# Patient Record
Sex: Male | Born: 1975 | Hispanic: Yes | Marital: Single | Smoking: Current every day smoker
Health system: Southern US, Community
[De-identification: ages and names within clinical notes are randomized; demographics above are authoritative.]

## PROBLEM LIST (undated history)

## (undated) DIAGNOSIS — E7800 Pure hypercholesterolemia, unspecified: Secondary | ICD-10-CM

## (undated) DIAGNOSIS — I10 Essential (primary) hypertension: Secondary | ICD-10-CM

## (undated) DIAGNOSIS — E119 Type 2 diabetes mellitus without complications: Secondary | ICD-10-CM

## (undated) HISTORY — DX: Type 2 diabetes mellitus without complications: E11.9

## (undated) HISTORY — PX: KNEE SURGERY: SHX244

---

## 2012-03-15 ENCOUNTER — Encounter (HOSPITAL_BASED_OUTPATIENT_CLINIC_OR_DEPARTMENT_OTHER): Payer: Self-pay

## 2012-03-15 ENCOUNTER — Emergency Department (HOSPITAL_BASED_OUTPATIENT_CLINIC_OR_DEPARTMENT_OTHER)
Admission: EM | Admit: 2012-03-15 | Discharge: 2012-03-15 | Disposition: A | Discharge status: Home / Self Care | Payer: BC Managed Care – PPO | Attending: Emergency Medicine | Admitting: Emergency Medicine

## 2012-03-15 DIAGNOSIS — I10 Essential (primary) hypertension: Secondary | ICD-10-CM | POA: Insufficient documentation

## 2012-03-15 DIAGNOSIS — H6692 Otitis media, unspecified, left ear: Secondary | ICD-10-CM

## 2012-03-15 DIAGNOSIS — H6690 Otitis media, unspecified, unspecified ear: Secondary | ICD-10-CM | POA: Insufficient documentation

## 2012-03-15 DIAGNOSIS — F17200 Nicotine dependence, unspecified, uncomplicated: Secondary | ICD-10-CM | POA: Insufficient documentation

## 2012-03-15 HISTORY — DX: Essential (primary) hypertension: I10

## 2012-03-15 MED ORDER — AMOXICILLIN 500 MG PO TABS: 1000.0000 mg | ORAL_TABLET | Freq: Three times a day (TID) | ORAL | Status: AC

## 2012-03-15 NOTE — ED Notes (Signed)
C/o "congestion" to left ear and decreased hearing-denies pian

## 2012-03-15 NOTE — ED Provider Notes (Signed)
History     CSN: [id number]  Arrival date & time 03/15/12  2117   First MD Initiated Contact with Patient 03/15/12 2152      Chief Complaint  Patient presents with  . Hearing Problem    (Consider location/radiation/quality/duration/timing/severity/associated sxs/prior treatment) HPI 2 weeks ago this 36 year old male has nasal congestion and a slight cough with some left ear congestion as well. His cough and nasal congestion have essentially resolved with only minimal residual nasal congestion. He however has persistent left ear fullness congestion feeling. He does not have severe left ear pain. Headache. He has no stiff neck. He is no rash. He is no current cough shortness of breath chest pain abdominal pain or vomiting. He is no vertigo. He took an over-the-counter decongestant occasionally over the last couple weeks which helped his nasal congestion. There is no treatment prior to arrival for the ear. He only has mild pain to the ear. Past Medical History  Diagnosis Date  . Hypertension     History reviewed. No pertinent past surgical history.  No family history on file.  History  Substance Use Topics  . Smoking status: Current Everyday Smoker  . Smokeless tobacco: Not on file  . Alcohol Use: Yes      Review of Systems  Constitutional: Negative for fever.       10 Systems reviewed and are negative for acute change except as noted in the HPI.  HENT: Positive for ear pain and congestion. Negative for facial swelling, neck pain, neck stiffness and ear discharge.   Eyes: Negative for discharge and redness.  Respiratory: Negative for cough and shortness of breath.   Cardiovascular: Negative for chest pain.  Gastrointestinal: Negative for vomiting and abdominal pain.  Musculoskeletal: Negative for back pain.  Skin: Negative for rash.  Neurological: Negative for syncope, numbness and headaches.  Psychiatric/Behavioral:       No behavior change.    Allergies  Review of  patient's allergies indicates no known allergies.  Home Medications   Current Outpatient Rx  Name Route Sig Dispense Refill  . VITAMIN D 1000 UNITS PO TABS Oral Take 1,000 Units by mouth daily.    Marland Kitchen HYDROCHLOROTHIAZIDE 12.5 MG PO CAPS Oral Take 12.5 mg by mouth daily.    . NEBIVOLOL HCL 10 MG PO TABS Oral Take 10 mg by mouth daily.    . DECONGESTANT PO Oral Take 1 tablet by mouth daily as needed. Patient used this medication for congestion.    Marland Kitchen ROSUVASTATIN CALCIUM 10 MG PO TABS Oral Take 10 mg by mouth daily.    . AMOXICILLIN 500 MG PO TABS Oral Take 2 tablets (1,000 mg total) by mouth 3 (three) times daily. X 10 days 60 tablet 0    BP 173/92  Pulse 83  Temp 98.5 F (36.9 C) (Oral)  Resp 20  Ht 5\' 8"  (1.727 m)  Wt 285 lb (129.275 kg)  BMI 43.33 kg/m2  SpO2 100%  Physical Exam  Nursing note and vitals reviewed. Constitutional:       Awake, alert, nontoxic appearance.  HENT:  Head: Atraumatic.  Right Ear: External ear normal.  Left Ear: External ear normal.  Mouth/Throat: Oropharynx is clear and moist. No oropharyngeal exudate.       Right tympanic membrane is clear, left tympanic membrane is purulent-appearing and bulging with loss of landmarks but normal external auditory canals bilaterally  Eyes: Right eye exhibits no discharge. Left eye exhibits no discharge.  Neck: Neck supple.  Cardiovascular: Normal  rate and regular rhythm.   No murmur heard. Pulmonary/Chest: Effort normal and breath sounds normal. No respiratory distress. He has no wheezes. He has no rales. He exhibits no tenderness.  Abdominal: Soft. There is no tenderness. There is no rebound.  Musculoskeletal: He exhibits no tenderness.       Baseline ROM, no obvious new focal weakness.  Lymphadenopathy:    He has no cervical adenopathy.  Neurological:       Mental status and motor strength appears baseline for patient and situation.  Skin: No rash noted.  Psychiatric: He has a normal mood and affect.     ED Course  Procedures (including critical care time)  Labs Reviewed - No data to display No results found.   1. Otitis media of left ear       MDM   Pt stable in ED with no significant deterioration in condition.Patient / Family / Caregiver informed of clinical course, understand medical decision-making process, and agree with plan.        Hurman Horn, MD 03/19/12 1330

## 2012-03-15 NOTE — ED Notes (Signed)
MD at bedside. 

## 2014-01-30 ENCOUNTER — Encounter (HOSPITAL_BASED_OUTPATIENT_CLINIC_OR_DEPARTMENT_OTHER): Payer: Self-pay | Admitting: Emergency Medicine

## 2014-01-30 ENCOUNTER — Emergency Department (HOSPITAL_BASED_OUTPATIENT_CLINIC_OR_DEPARTMENT_OTHER): Payer: Self-pay

## 2014-01-30 ENCOUNTER — Emergency Department (HOSPITAL_BASED_OUTPATIENT_CLINIC_OR_DEPARTMENT_OTHER)
Admission: EM | Admit: 2014-01-30 | Discharge: 2014-01-30 | Disposition: A | Discharge status: Home / Self Care | Payer: Self-pay | Attending: Emergency Medicine | Admitting: Emergency Medicine

## 2014-01-30 ENCOUNTER — Emergency Department (HOSPITAL_BASED_OUTPATIENT_CLINIC_OR_DEPARTMENT_OTHER): Payer: BC Managed Care – PPO

## 2014-01-30 DIAGNOSIS — E669 Obesity, unspecified: Secondary | ICD-10-CM | POA: Insufficient documentation

## 2014-01-30 DIAGNOSIS — I10 Essential (primary) hypertension: Secondary | ICD-10-CM | POA: Insufficient documentation

## 2014-01-30 DIAGNOSIS — R609 Edema, unspecified: Secondary | ICD-10-CM | POA: Insufficient documentation

## 2014-01-30 DIAGNOSIS — E785 Hyperlipidemia, unspecified: Secondary | ICD-10-CM | POA: Insufficient documentation

## 2014-01-30 DIAGNOSIS — Z79899 Other long term (current) drug therapy: Secondary | ICD-10-CM | POA: Insufficient documentation

## 2014-01-30 DIAGNOSIS — F17200 Nicotine dependence, unspecified, uncomplicated: Secondary | ICD-10-CM | POA: Insufficient documentation

## 2014-01-30 DIAGNOSIS — J159 Unspecified bacterial pneumonia: Secondary | ICD-10-CM | POA: Insufficient documentation

## 2014-01-30 DIAGNOSIS — J189 Pneumonia, unspecified organism: Secondary | ICD-10-CM

## 2014-01-30 HISTORY — DX: Pure hypercholesterolemia, unspecified: E78.00

## 2014-01-30 LAB — CBC WITH DIFFERENTIAL/PLATELET
BASOS ABS: 0 10*3/uL (ref 0.0–0.1)
Basophils Relative: 1 % (ref 0–1)
Eosinophils Absolute: 0.2 10*3/uL (ref 0.0–0.7)
Eosinophils Relative: 3 % (ref 0–5)
HEMATOCRIT: 35.9 % — AB (ref 39.0–52.0)
Hemoglobin: 12.2 g/dL — ABNORMAL LOW (ref 13.0–17.0)
LYMPHS PCT: 17 % (ref 12–46)
Lymphs Abs: 1 10*3/uL (ref 0.7–4.0)
MCH: 27.2 pg (ref 26.0–34.0)
MCHC: 34 g/dL (ref 30.0–36.0)
MCV: 80 fL (ref 78.0–100.0)
Monocytes Absolute: 0.3 10*3/uL (ref 0.1–1.0)
Monocytes Relative: 5 % (ref 3–12)
NEUTROS ABS: 4.7 10*3/uL (ref 1.7–7.7)
Neutrophils Relative %: 76 % (ref 43–77)
PLATELETS: 261 10*3/uL (ref 150–400)
RBC: 4.49 MIL/uL (ref 4.22–5.81)
RDW: 13.4 % (ref 11.5–15.5)
WBC: 6.3 10*3/uL (ref 4.0–10.5)

## 2014-01-30 LAB — BASIC METABOLIC PANEL
BUN: 15 mg/dL (ref 6–23)
CHLORIDE: 103 meq/L (ref 96–112)
CO2: 23 meq/L (ref 19–32)
Calcium: 9 mg/dL (ref 8.4–10.5)
Creatinine, Ser: 1 mg/dL (ref 0.50–1.35)
GFR calc Af Amer: >90 mL/min (ref >90)
GFR calc non Af Amer: >90 mL/min (ref >90)
Glucose, Bld: 243 mg/dL — ABNORMAL HIGH (ref 70–99)
Potassium: 4.3 mEq/L (ref 3.7–5.3)
SODIUM: 140 meq/L (ref 137–147)

## 2014-01-30 LAB — D-DIMER, QUANTITATIVE (NOT AT ARMC): D DIMER QUANT: 0.52 ug{FEU}/mL — AB (ref 0.00–0.48)

## 2014-01-30 LAB — TROPONIN I

## 2014-01-30 LAB — PRO B NATRIURETIC PEPTIDE: PRO B NATRI PEPTIDE: 398.5 pg/mL — AB (ref 0–125)

## 2014-01-30 MED ORDER — ASPIRIN 81 MG PO CHEW
324.0000 mg | CHEWABLE_TABLET | Freq: Once | ORAL | Status: AC
  Administered 2014-01-30: 324 mg via ORAL
  Filled 2014-01-30: qty 4

## 2014-01-30 MED ORDER — CEFTRIAXONE SODIUM 1 G IJ SOLR
INTRAMUSCULAR | Status: AC
  Filled 2014-01-30: qty 10

## 2014-01-30 MED ORDER — LEVOFLOXACIN 750 MG PO TABS: 750.0000 mg | ORAL_TABLET | Freq: Every day | ORAL | Status: DC

## 2014-01-30 MED ORDER — ALBUTEROL SULFATE (2.5 MG/3ML) 0.083% IN NEBU
5.0000 mg | INHALATION_SOLUTION | Freq: Once | RESPIRATORY_TRACT | Status: AC
  Administered 2014-01-30: 5 mg via RESPIRATORY_TRACT
  Filled 2014-01-30: qty 6

## 2014-01-30 MED ORDER — IOHEXOL 350 MG/ML SOLN
100.0000 mL | Freq: Once | INTRAVENOUS | Status: AC | PRN
  Administered 2014-01-30: 100 mL via INTRAVENOUS

## 2014-01-30 MED ORDER — DEXTROSE 5 % IV SOLN
500.0000 mg | Freq: Once | INTRAVENOUS | Status: AC
  Administered 2014-01-30: 500 mg via INTRAVENOUS

## 2014-01-30 MED ORDER — DEXTROSE 5 % IV SOLN
1.0000 g | Freq: Once | INTRAVENOUS | Status: AC
  Administered 2014-01-30: 1 g via INTRAVENOUS

## 2014-01-30 NOTE — ED Provider Notes (Signed)
CSN: [id number]     Arrival date & time 01/30/14  0813 History   First MD Initiated Contact with Patient 01/30/14 [phone]     Chief Complaint  Patient presents with  . Shortness of Breath     (Consider location/radiation/quality/duration/timing/severity/associated sxs/prior Treatment) Patient is a 38 y.o. male presenting with shortness of breath.  Shortness of Breath  Pt with history of obesity, OSA, HTN and HLD but no known CAD reports 2 days of SOB, worse with lying flat and with walking, associated with R sided chest tightness yesterday but none today. He has been off his HTN meds for several months, restarted 3 days ago. 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 No N/V, dysuria. Is a smoker, denies illicit drug use.   Past Medical History  Diagnosis Date  . Hypertension    No past surgical history on file. No family history on file. History  Substance Use Topics  . Smoking status: Current Every Day Smoker  . Smokeless tobacco: Not on file  . Alcohol Use: Yes    Review of Systems  Respiratory: Positive for shortness of breath.    All other systems reviewed and are negative except as noted in HPI.     Allergies  Review of patient's allergies indicates no known allergies.  Home Medications   Prior to Admission medications   Medication Sig Start Date End Date Taking? Authorizing Provider  cholecalciferol (VITAMIN D) 1000 UNITS tablet Take 1,000 Units by mouth daily.    Historical Provider, MD  hydrochlorothiazide (MICROZIDE) 12.5 MG capsule Take 12.5 mg by mouth daily.    Historical Provider, MD  nebivolol (BYSTOLIC) 10 MG tablet Take 10 mg by mouth daily.    Historical Provider, MD  Pseudoephedrine HCl (DECONGESTANT PO) Take 1 tablet by mouth daily as needed. Patient used this medication for congestion.    Historical Provider, MD  rosuvastatin (CRESTOR) 10 MG tablet Take 10 mg by mouth daily.    Historical Provider, MD   BP 159/91  Pulse 85  Resp 20   Ht 5\' 8"  (1.727 m)  Wt 300 lb (136.079 kg)  BMI 45.63 kg/m2  SpO2 97% Physical Exam  Nursing note and vitals reviewed. Constitutional: He is oriented to person, place, and time. He appears well-developed and well-nourished.  HENT:  Head: Normocephalic and atraumatic.  Eyes: EOM are normal. Pupils are equal, round, and reactive to light.  Neck: Normal range of motion. Neck supple.  Cardiovascular: Normal rate, normal heart sounds and intact distal pulses.   Pulmonary/Chest: Effort normal and breath sounds normal. He has no wheezes. He has no rales. He exhibits no tenderness.  Abdominal: Bowel sounds are normal. He exhibits no distension. There is no tenderness.  Musculoskeletal: Normal range of motion. He exhibits edema (trace bilateral and symmetric). He exhibits no tenderness.  Neurological: He is alert and oriented to person, place, and time. He has normal strength. No cranial nerve deficit or sensory deficit.  Skin: Skin is warm and dry. No rash noted.  Psychiatric: He has a normal mood and affect.    ED Course  Procedures (including critical care time) Labs Review Labs Reviewed  CBC WITH DIFFERENTIAL - Abnormal; Notable for the following:    Hemoglobin 12.2 (*)    HCT 35.9 (*)    All other components within normal limits  BASIC METABOLIC PANEL - Abnormal; Notable for the following:    Glucose, Bld 243 (*)    All other components within normal  limits  PRO B NATRIURETIC PEPTIDE - Abnormal; Notable for the following:    Pro B Natriuretic peptide (BNP) 398.5 (*)    All other components within normal limits  D-DIMER, QUANTITATIVE - Abnormal; Notable for the following:    D-Dimer, Quant 0.52 (*)    All other components within normal limits  TROPONIN I    Imaging Review Dg Chest 2 View  01/30/2014   CLINICAL DATA:  Shortness of breath and chest tightness ; history of tobacco use  EXAM: CHEST  2 VIEW  COMPARISON:  None.  FINDINGS: The lungs are adequately inflated. There is  alveolar density in the right perihilar and infrahilar region. The cardiopericardial silhouette is top-normal in size. The pulmonary vascularity is engorged. There is no pleural effusion. The observed portions of the bony thorax appear normal.  IMPRESSION: There is mild pulmonary vascular congestion. Confluent density in the right hilar infrahilar regions may reflect focal alveolar edema. Pneumonia is not excluded. Followup films following therapy are recommended to assure complete clearing. Chest CT scanning the may be ultimately required to exclude an underlying right hilar mass.   Electronically Signed   By: David  Swazil[place]Jordan   On: 01/30/2014 09:07   Ct Angio Chest Pe W/cm &/or Wo Cm  01/30/2014   CLINICAL DATA:  38 year old male with shortness of Breath chest tightness and abnormal D-dimer. Initial encounter.  EXAM: CT ANGIOGRAPHY CHEST WITH CONTRAST  TECHNIQUE: Multidetector CT imaging of the chest was performed using the standard protocol during bolus administration of intravenous contrast. Multiplanar CT image reconstructions and MIPs were obtained to evaluate the vascular anatomy.  CONTRAST:  100mL OMNIPAQUE IOHEXOL 350 MG/ML SOLN  COMPARISON:  Chest radiographs 0904 hr the same day.  FINDINGS: Adequate contrast bolus timing in the pulmonary arterial tree. Intermittent respiratory motion artifact. No focal filling defect identified in the pulmonary arterial tree to suggest the presence of acute pulmonary embolism.  Cardiomegaly. No pericardial effusion. There are small bilateral layering pleural effusions, greater on the right.  Major airways are patent. There is peribronchovascular ground-glass and more confluent nodular pulmonary opacity in all lobes except the left upper lobe and lingula. There is some lower lobe septal thickening superimposed. No consolidation at this time. There is peribronchial thickening.  No mediastinal or hilar lymphadenopathy. In the upper abdomen negative visualized liver, spleen,  pancreas, adrenal glands, kidneys, and bowel. Negative thoracic inlet. No acute osseous abnormality identified.  Review of the MIP images confirms the above findings.  IMPRESSION: 1. No evidence of acute pulmonary embolus. 2. Bilateral multilobar pneumonia. Small layering pleural effusions right greater than left. 3. Cardiomegaly.  No pericardial effusion.   Electronically Signed   By: Augusto GambleLee  Hall M.D.   On: 01/30/2014 10:25     EKG Interpretation   Date/Time:  Tuesday Jan 30 2014 08:24:25 EDT Ventricular Rate:  84 PR Interval:  162 QRS Duration: 104 QT Interval:  378 QTC Calculation: 446 R Axis:   59 Text Interpretation:  Normal sinus rhythm Normal ECG No old tracing to  compare Confirmed by [facility]LD[facility]  MD, Leonette MostHARLES [phone]) on 01/30/2014 8:40:38 AM      MDM   Final diagnoses:  CAP (community acquired pneumonia)     Labs and imaging reviewed. CT neg for PE, but shows pneumonia. Will treat as CAP, otherwise looking well. Plan outpatient treatment with PCP followup.    Charles B. Bernette MayersSheldon, MD 01/30/14 [id number]

## 2014-01-30 NOTE — ED Notes (Signed)
Patient transported to X-ray 

## 2014-01-30 NOTE — ED Notes (Signed)
Patient transported to CT 

## 2014-01-30 NOTE — Discharge Instructions (Signed)

## 2014-01-30 NOTE — ED Notes (Signed)
Patient c/o cob and intermittent CP that started yesterday, has not been taking blood pressure medicine

## 2015-02-25 ENCOUNTER — Emergency Department (HOSPITAL_BASED_OUTPATIENT_CLINIC_OR_DEPARTMENT_OTHER): Payer: Self-pay

## 2015-02-25 ENCOUNTER — Emergency Department (HOSPITAL_BASED_OUTPATIENT_CLINIC_OR_DEPARTMENT_OTHER)
Admission: EM | Admit: 2015-02-25 | Discharge: 2015-02-25 | Disposition: A | Discharge status: Home / Self Care | Payer: Self-pay | Attending: Emergency Medicine | Admitting: Emergency Medicine

## 2015-02-25 ENCOUNTER — Encounter (HOSPITAL_BASED_OUTPATIENT_CLINIC_OR_DEPARTMENT_OTHER): Payer: Self-pay | Admitting: Emergency Medicine

## 2015-02-25 DIAGNOSIS — R52 Pain, unspecified: Secondary | ICD-10-CM

## 2015-02-25 DIAGNOSIS — Z79899 Other long term (current) drug therapy: Secondary | ICD-10-CM | POA: Insufficient documentation

## 2015-02-25 DIAGNOSIS — M25562 Pain in left knee: Secondary | ICD-10-CM | POA: Insufficient documentation

## 2015-02-25 DIAGNOSIS — Z792 Long term (current) use of antibiotics: Secondary | ICD-10-CM | POA: Insufficient documentation

## 2015-02-25 DIAGNOSIS — Z87891 Personal history of nicotine dependence: Secondary | ICD-10-CM | POA: Insufficient documentation

## 2015-02-25 DIAGNOSIS — E780 Pure hypercholesterolemia: Secondary | ICD-10-CM | POA: Insufficient documentation

## 2015-02-25 DIAGNOSIS — I10 Essential (primary) hypertension: Secondary | ICD-10-CM | POA: Insufficient documentation

## 2015-02-25 MED ORDER — HYDROCODONE-ACETAMINOPHEN 5-325 MG PO TABS: 1.0000 | ORAL_TABLET | Freq: Four times a day (QID) | ORAL | Status: DC | PRN

## 2015-02-25 MED ORDER — PREDNISONE 10 MG PO TABS: 20.0000 mg | ORAL_TABLET | Freq: Two times a day (BID) | ORAL | Status: DC

## 2015-02-25 NOTE — Discharge Instructions (Signed)
Prednisone as prescribed.  Hydrocodone as prescribed as needed for pain.  Follow-up with your primary Dr. if not improving in the next week to discuss possible referral to an orthopedist.   Knee Pain The knee is the complex joint between your thigh and your lower leg. It is made up of bones, tendons, ligaments, and cartilage. The bones that make up the knee are:  The femur in the thigh.  The tibia and fibula in the lower leg.  The patella or kneecap riding in the groove on the lower femur. CAUSES  Knee pain is a common complaint with many causes. A few of these causes are:  Injury, such as:  A ruptured ligament or tendon injury.  Torn cartilage.  Medical conditions, such as:  Gout  Arthritis  Infections  Overuse, over training, or overdoing a physical activity. Knee pain can be minor or severe. Knee pain can accompany debilitating injury. Minor knee problems often respond well to self-care measures or get well on their own. More serious injuries may need medical intervention or even surgery. SYMPTOMS The knee is complex. Symptoms of knee problems can vary widely. Some of the problems are:  Pain with movement and weight bearing.  Swelling and tenderness.  Buckling of the knee.  Inability to straighten or extend your knee.  Your knee locks and you cannot straighten it.  Warmth and redness with pain and fever.  Deformity or dislocation of the kneecap. DIAGNOSIS  Determining what is wrong may be very straight forward such as when there is an injury. It can also be challenging because of the complexity of the knee. Tests to make a diagnosis may include:  Your caregiver taking a history and doing a physical exam.  Routine X-rays can be used to rule out other problems. X-rays will not reveal a cartilage tear. Some injuries of the knee can be diagnosed by:  Arthroscopy a surgical technique by which a small video camera is inserted through tiny incisions on the sides  of the knee. This procedure is used to examine and repair internal knee joint problems. Tiny instruments can be used during arthroscopy to repair the torn knee cartilage (meniscus).  Arthrography is a radiology technique. A contrast liquid is directly injected into the knee joint. Internal structures of the knee joint then become visible on X-ray film.  An MRI scan is a non X-ray radiology procedure in which magnetic fields and a computer produce two- or three-dimensional images of the inside of the knee. Cartilage tears are often visible using an MRI scanner. MRI scans have largely replaced arthrography in diagnosing cartilage tears of the knee.  Blood work.  Examination of the fluid that helps to lubricate the knee joint (synovial fluid). This is done by taking a sample out using a needle and a syringe. TREATMENT The treatment of knee problems depends on the cause. Some of these treatments are:  Depending on the injury, proper casting, splinting, surgery, or physical therapy care will be needed.  Give yourself adequate recovery time. Do not overuse your joints. If you begin to get sore during workout routines, back off. Slow down or do fewer repetitions.  For repetitive activities such as cycling or running, maintain your strength and nutrition.  Alternate muscle groups. For example, if you are a weight lifter, work the upper body on one day and the lower body the next.  Either tight or weak muscles do not give the proper support for your knee. Tight or weak muscles do not  absorb the stress placed on the knee joint. Keep the muscles surrounding the knee strong.  Take care of mechanical problems.  If you have flat feet, orthotics or special shoes may help. See your caregiver if you need help.  Arch supports, sometimes with wedges on the inner or outer aspect of the heel, can help. These can shift pressure away from the side of the knee most bothered by osteoarthritis.  A brace called an  "unloader" brace also may be used to help ease the pressure on the most arthritic side of the knee.  If your caregiver has prescribed crutches, braces, wraps or ice, use as directed. The acronym for this is PRICE. This means protection, rest, ice, compression, and elevation.  Nonsteroidal anti-inflammatory drugs (NSAIDs), can help relieve pain. But if taken immediately after an injury, they may actually increase swelling. Take NSAIDs with food in your stomach. Stop them if you develop stomach problems. Do not take these if you have a history of ulcers, stomach pain, or bleeding from the bowel. Do not take without your caregiver's approval if you have problems with fluid retention, heart failure, or kidney problems.  For ongoing knee problems, physical therapy may be helpful.  Glucosamine and chondroitin are over-the-counter dietary supplements. Both may help relieve the pain of osteoarthritis in the knee. These medicines are different from the usual anti-inflammatory drugs. Glucosamine may decrease the rate of cartilage destruction.  Injections of a corticosteroid drug into your knee joint may help reduce the symptoms of an arthritis flare-up. They may provide pain relief that lasts a few months. You may have to wait a few months between injections. The injections do have a small increased risk of infection, water retention, and elevated blood sugar levels.  Hyaluronic acid injected into damaged joints may ease pain and provide lubrication. These injections may work by reducing inflammation. A series of shots may give relief for as long as 6 months.  Topical painkillers. Applying certain ointments to your skin may help relieve the pain and stiffness of osteoarthritis. Ask your pharmacist for suggestions. Many over the-counter products are approved for temporary relief of arthritis pain.  In some countries, doctors often prescribe topical NSAIDs for relief of chronic conditions such as arthritis and  tendinitis. A review of treatment with NSAID creams found that they worked as well as oral medications but without the serious side effects. PREVENTION  Maintain a healthy weight. Extra pounds put more strain on your joints.  Get strong, stay limber. Weak muscles are a common cause of knee injuries. Stretching is important. Include flexibility exercises in your workouts.  Be smart about exercise. If you have osteoarthritis, chronic knee pain or recurring injuries, you may need to change the way you exercise. This does not mean you have to stop being active. If your knees ache after jogging or playing basketball, consider switching to swimming, water aerobics, or other low-impact activities, at least for a few days a week. Sometimes limiting high-impact activities will provide relief.  Make sure your shoes fit well. Choose footwear that is right for your sport.  Protect your knees. Use the proper gear for knee-sensitive activities. Use kneepads when playing volleyball or laying carpet. Buckle your seat belt every time you drive. Most shattered kneecaps occur in car accidents.  Rest when you are tired. SEEK MEDICAL CARE IF:  You have knee pain that is continual and does not seem to be getting better.  SEEK IMMEDIATE MEDICAL CARE IF:  Your knee joint feels  hot to the touch and you have a high fever. MAKE SURE YOU:   Understand these instructions.  Will watch your condition.  Will get help right away if you are not doing well or get worse. Document Released: 06/28/2007 Document Revised: 11/23/2011 Document Reviewed: 06/28/2007 [facility] Patient Information 2015 [place], [place]. This information is not intended to replace advice given to you by your health care provider. Make sure you discuss any questions you have with your health care provider.

## 2015-02-25 NOTE — ED Notes (Signed)
Patient reports left knee pain which began 2 weeks ago.  Reports chronic knee pain.

## 2015-02-25 NOTE — ED Provider Notes (Signed)
CSN: [id number]     Arrival date & time 02/25/15  4287 History  This chart was scribed for Geoffery Lyons, MD by Lyndel Safe, ED Scribe. This patient was seen in room [facility] and the patient's care was started 7:22 PM.   Chief Complaint  Patient presents with  . Knee Pain   The history is provided by the patient. No language interpreter was used.   HPI Comments: Thomas Lester is a 39 y.o. male who presents to the Emergency Department complaining of gradually worsening, constant, tight, left knee pain across his front knee laterally onset 2 weeks ago. Pt reports problems with his left knee in the past. He states he does a lot of walking up and down stairs at work. Pt denies any previous surgical history on his knee, pain in his right knee, difficulty walking, or fevers.   Past Medical History  Diagnosis Date  . Hypertension   . High cholesterol    History reviewed. No pertinent past surgical history. History reviewed. No pertinent family history. History  Substance Use Topics  . Smoking status: Former Games developer  . Smokeless tobacco: Not on file  . Alcohol Use: Yes     Comment: occassional    Review of Systems  Constitutional: Negative for fever.  Musculoskeletal: Positive for arthralgias. Negative for gait problem.   A complete 10 system review of systems was obtained and is otherwise negative except at noted in the HPI and PMH.  Allergies  Review of patient's allergies indicates no known allergies.  Home Medications   Prior to Admission medications   Medication Sig Start Date End Date Taking? Authorizing Provider  cholecalciferol (VITAMIN D) 1000 UNITS tablet Take 1,000 Units by mouth daily.    Historical Provider, MD  hydrochlorothiazide (MICROZIDE) 12.5 MG capsule Take 12.5 mg by mouth daily.    Historical Provider, MD  levofloxacin (LEVAQUIN) 750 MG tablet Take 1 tablet (750 mg total) by mouth daily. 01/30/14   Susy Frizzle, MD  nebivolol (BYSTOLIC) 10 MG tablet Take 10 mg  by mouth daily.    Historical Provider, MD  Pseudoephedrine HCl (DECONGESTANT PO) Take 1 tablet by mouth daily as needed. Patient used this medication for congestion.    Historical Provider, MD  rosuvastatin (CRESTOR) 10 MG tablet Take 10 mg by mouth daily.    Historical Provider, MD   BP 191/99 mmHg  Pulse 100  Temp(Src) 98.6 F (37 C) (Oral)  Resp 16  Ht 5\' 8"  (1.727 m)  Wt 300 lb (136.079 kg)  BMI 45.63 kg/m2  SpO2 96%  Physical Exam  Constitutional: He is oriented to person, place, and time. He appears well-developed and well-nourished. No distress.  HENT:  Head: Normocephalic.  Right Ear: External ear normal.  Left Ear: External ear normal.  Mouth/Throat: No oropharyngeal exudate.  Eyes: Pupils are equal, round, and reactive to light. Right eye exhibits no discharge. Left eye exhibits no discharge. No scleral icterus.  Neck: No JVD present.  Cardiovascular: Normal rate, regular rhythm and normal heart sounds.   Pulmonary/Chest: Effort normal and breath sounds normal. No respiratory distress.  Musculoskeletal: He exhibits no edema.  The left knee appears grossly normal. There is no effusion or erythema. He has good ROM with no crepitus. Stable AP and laterally.  Lymphadenopathy:    He has no cervical adenopathy.  Neurological: He is alert and oriented to person, place, and time.  Skin: Skin is warm. No rash noted. No erythema. No pallor.  Psychiatric: He has a normal  mood and affect. His behavior is normal.  Nursing note and vitals reviewed.   ED Course  Procedures  DIAGNOSTIC STUDIES: Oxygen Saturation is 96% on RA, adequate by my interpretation.    COORDINATION OF CARE: 7:25 PM Discussed treatment plan which includes to get diagnostic imaging of left knee  with pt. Pt acknowledges and agrees to plan.   Labs Review Labs Reviewed - No data to display  Imaging Review No results found.   EKG Interpretation None      MDM   Final diagnoses:  None     X-rays are suggestive of osteoarthritis. I suspect this is the cause of his discomfort. This will be treated with prednisone, pain medication, and when necessary follow-up with orthopedics.  I personally performed the services described in this documentation, which was scribed in my presence. The recorded information has been reviewed and is accurate.      Geoffery Lyons, MD 02/25/15 2005

## 2015-03-12 ENCOUNTER — Telehealth: Payer: Self-pay | Admitting: Behavioral Health

## 2015-03-12 ENCOUNTER — Encounter: Payer: Self-pay | Admitting: Behavioral Health

## 2015-03-12 NOTE — Telephone Encounter (Signed)
Pre-Visit Call completed with patient and chart updated.   Pre-Visit Info documented in Specialty Comments under [employer].    

## 2015-03-13 ENCOUNTER — Telehealth: Payer: Self-pay | Admitting: Medical

## 2015-03-13 ENCOUNTER — Ambulatory Visit (INDEPENDENT_AMBULATORY_CARE_PROVIDER_SITE_OTHER): Payer: BC Managed Care – PPO | Admitting: Medical

## 2015-03-13 ENCOUNTER — Encounter: Payer: Self-pay | Admitting: Medical

## 2015-03-13 VITALS — BP 191/103 | HR 94 | Temp 98.7°F | Ht 68.5 in | Wt 301.0 lb

## 2015-03-13 DIAGNOSIS — I10 Essential (primary) hypertension: Secondary | ICD-10-CM | POA: Diagnosis not present

## 2015-03-13 DIAGNOSIS — R739 Hyperglycemia, unspecified: Secondary | ICD-10-CM

## 2015-03-13 DIAGNOSIS — M25562 Pain in left knee: Secondary | ICD-10-CM | POA: Diagnosis not present

## 2015-03-13 DIAGNOSIS — E785 Hyperlipidemia, unspecified: Secondary | ICD-10-CM

## 2015-03-13 DIAGNOSIS — M25569 Pain in unspecified knee: Secondary | ICD-10-CM | POA: Insufficient documentation

## 2015-03-13 LAB — COMPREHENSIVE METABOLIC PANEL
ALBUMIN: 3.8 g/dL (ref 3.5–5.2)
ALT: 16 U/L (ref 0–53)
AST: 10 U/L (ref 0–37)
Alkaline Phosphatase: 63 U/L (ref 39–117)
BUN: 18 mg/dL (ref 6–23)
CALCIUM: 9.2 mg/dL (ref 8.4–10.5)
CHLORIDE: 103 meq/L (ref 96–112)
CO2: 28 meq/L (ref 19–32)
CREATININE: 0.97 mg/dL (ref 0.40–1.50)
GFR: 90.91 mL/min (ref >60.00)
GLUCOSE: 234 mg/dL — AB (ref 70–99)
POTASSIUM: 3.5 meq/L (ref 3.5–5.1)
Sodium: 139 mEq/L (ref 135–145)
TOTAL PROTEIN: 6.7 g/dL (ref 6.0–8.3)
Total Bilirubin: 0.4 mg/dL (ref 0.2–1.2)

## 2015-03-13 LAB — CBC WITH DIFFERENTIAL/PLATELET
BASOS PCT: 4.1 % — AB (ref 0.0–3.0)
Basophils Absolute: 0.3 10*3/uL — ABNORMAL HIGH (ref 0.0–0.1)
EOS PCT: 1.5 % (ref 0.0–5.0)
Eosinophils Absolute: 0.1 10*3/uL (ref 0.0–0.7)
HEMATOCRIT: 38.4 % — AB (ref 39.0–52.0)
HEMOGLOBIN: 12.9 g/dL — AB (ref 13.0–17.0)
Lymphocytes Relative: 29.5 % (ref 12.0–46.0)
Lymphs Abs: 2.4 10*3/uL (ref 0.7–4.0)
MCHC: 33.6 g/dL (ref 30.0–36.0)
MCV: 79.3 fl (ref 78.0–100.0)
MONO ABS: 0.4 10*3/uL (ref 0.1–1.0)
MONOS PCT: 4.8 % (ref 3.0–12.0)
NEUTROS PCT: 60.1 % (ref 43.0–77.0)
Neutro Abs: 4.9 10*3/uL (ref 1.4–7.7)
PLATELETS: 293 10*3/uL (ref 150.0–400.0)
RBC: 4.84 Mil/uL (ref 4.22–5.81)
RDW: 13.5 % (ref 11.5–15.5)
WBC: 8.1 10*3/uL (ref 4.0–10.5)

## 2015-03-13 MED ORDER — METFORMIN HCL 500 MG PO TABS: 500.0000 mg | ORAL_TABLET | Freq: Two times a day (BID) | ORAL | Status: AC

## 2015-03-13 MED ORDER — LOSARTAN POTASSIUM-HCTZ 100-12.5 MG PO TABS: 1.0000 | ORAL_TABLET | Freq: Every day | ORAL | Status: DC

## 2015-03-13 NOTE — Progress Notes (Signed)
Subjective:    Patient ID: Thomas SpenceJose Haertel, male    DOB: 1976/04/30, 39 y.o.   MRN: [id number]  HPI   I have reviewed pt PMH, PSH, FH, Social History and Surgical History  Htn- Pt has history for 5 or more years. Bystolic in past. In past no insurance so he states that is why it was stopped. Pt recently on hctz. Has been on that for a year. Early on when he started he had different job/sweating more and had cramps. But none after 1st week. Pt can't remember last time labs done. No cardiac or neuro signs or symptoms. Then states other morning after work his calfs cramped very slightly. Pt has been out of hctz for months.   Hyperlipidemia- Pt on crestor in past but not recently since ran out. He is not fasting.   Knee pain- pt saw the ED. They did xrays recently. He was given prednisone. Given vicodin for the pain. Mild degenerative changes on xray. Pain worse with new job working.  Former smoker of 1  pack every 3 days. From 1994 until 2015.  Pt works Public relations account executivecorrectional officer, Research officer, political partyWalks a lot at work, 1 cup a day at [employer]. Some days none, Wife describes poor diet, But wife improving his diet, has girlfriend. 4 children.   Review of Systems  Constitutional: Negative for fever, chills, diaphoresis, activity change and fatigue.  Respiratory: Negative for cough, chest tightness and shortness of breath.   Cardiovascular: Negative for chest pain, palpitations and leg swelling.  Gastrointestinal: Negative for nausea, vomiting and abdominal pain.  Musculoskeletal: Negative for neck pain and neck stiffness.  Neurological: Negative for dizziness, tremors, seizures, syncope, facial asymmetry, speech difficulty, weakness, light-headedness, numbness and headaches.  Psychiatric/Behavioral: Negative for behavioral problems, confusion and agitation. The patient is not nervous/anxious.       Past Medical History  Diagnosis Date  . Hypertension   . High cholesterol     History   Social History  . Marital  Status: Significant Other    Spouse Name: N/A  . Number of Children: N/A  . Years of Education: N/A   Occupational History  . Not on file.   Social History Main Topics  . Smoking status: Former Games developermoker  . Smokeless tobacco: Not on file  . Alcohol Use: Yes     Comment: occassional.  Rare 1 beer.  . Drug Use: No  . Sexual Activity: Yes   Other Topics Concern  . Not on file   Social History Narrative    No past surgical history on file.  Family History  Problem Relation Age of Onset  . Diabetes Mother   . Diabetes Father   . Hypertension Father     No Known Allergies  Current Outpatient Prescriptions on File Prior to Visit  Medication Sig Dispense Refill  . cholecalciferol (VITAMIN D) 1000 UNITS tablet Take 1,000 Units by mouth daily.    . hydrochlorothiazide (MICROZIDE) 12.5 MG capsule Take 12.5 mg by mouth daily.    Marland Kitchen. HYDROcodone-acetaminophen (NORCO) 5-325 MG per tablet Take 1-2 tablets by mouth every 6 (six) hours as needed. 15 tablet 0  . predniSONE (DELTASONE) 10 MG tablet Take 2 tablets (20 mg total) by mouth 2 (two) times daily. 20 tablet 0  . rosuvastatin (CRESTOR) 10 MG tablet Take 10 mg by mouth daily.     No current facility-administered medications on file prior to visit.    BP 191/103 mmHg  Pulse 94  Temp(Src) 98.7 F (37.1 C) (Oral)  Ht 5' 8.5" (1.74 m)  Wt 301 lb (136.533 kg)  BMI 45.10 kg/m2  SpO2 97%       Objective:   Physical Exam  General Mental Status- Alert. General Appearance- Not in acute distress.   Skin General: Color- Normal Color. Moisture- Normal Moisture.  Neck Carotid Arteries- Normal color. Moisture- Normal Moisture. No carotid bruits. No JVD.  Chest and Lung Exam Auscultation: Breath Sounds:-Normal.  Cardiovascular Auscultation:Rythm- Regular. Murmurs & Other Heart Sounds:Auscultation of the heart reveals- No Murmurs.  Abdomen Inspection:-Inspeection Normal. Palpation/Percussion:Note:No mass. Palpation and  Percussion of the abdomen reveal- Non Tender, Non Distended + BS, no rebound or guarding.  Lt knee- mild crepitus on rom. No redness or warmth.    Neurologic Cranial Nerve exam:- CN III-XII intact(No nystagmus), symmetric smile. Drift Test:- No drift. Romberg Exam:- Negative.  Heal to Toe Gait exam:-Normal. Finger to Nose:- Normal/Intact Strength:- 5/5 equal and symmetric strength both upper and lower extremities.       Assessment & Plan:

## 2015-03-13 NOTE — Assessment & Plan Note (Signed)
Pt not fasting. Will get fasting lipid panel on next visit or on upcoming wellness exam.

## 2015-03-13 NOTE — Telephone Encounter (Signed)
a1c added due to sugar elevated.

## 2015-03-13 NOTE — Progress Notes (Signed)
Pre visit review using our clinic review tool, if applicable. No additional management support is needed unless otherwise documented below in the visit note. 

## 2015-03-13 NOTE — Assessment & Plan Note (Signed)
Very high and off his hctz. I don't think this will be adequate alone to control your bp. So will rx hyzaar. Get cmp and cbc today. Get bp cuff monitor and check bp daily. If bp increases or neurologic or cardiac signs or symptoms then ED evaluation.  Follow up next Tuesday in am or as needed.

## 2015-03-13 NOTE — Patient Instructions (Addendum)
HTN (hypertension) Very high and off his hctz. I don't think this will be adequate alone to control your bp. So will rx hyzaar. Get cmp and cbc today. Get bp cuff monitor and check bp daily. If bp increases or neurologic or cardiac signs or symptoms then ED evaluation.  Follow up next Tuesday in am or as needed.  Knee pain Minimal degenerative changes. But pain worsening gradually with new job and walking. Refer to orthopedist.  Hyperlipidemia Pt not fasting. Will get fasting lipid panel on next visit or on upcoming wellness exam.    Follow up this coming Tuesday or as needed  Please get bp monitor and check daily. Rx for monitor written.

## 2015-03-13 NOTE — Assessment & Plan Note (Signed)
Minimal degenerative changes. But pain worsening gradually with new job and walking. Refer to orthopedist.

## 2015-03-18 NOTE — Telephone Encounter (Signed)
Med sent to pharmacy.

## 2015-03-19 ENCOUNTER — Ambulatory Visit (INDEPENDENT_AMBULATORY_CARE_PROVIDER_SITE_OTHER): Payer: BC Managed Care – PPO | Admitting: Medical

## 2015-03-19 ENCOUNTER — Encounter: Payer: Self-pay | Admitting: Medical

## 2015-03-19 VITALS — BP 150/90 | HR 96 | Temp 98.0°F | Ht 68.0 in | Wt 300.4 lb

## 2015-03-19 DIAGNOSIS — R7989 Other specified abnormal findings of blood chemistry: Secondary | ICD-10-CM | POA: Diagnosis not present

## 2015-03-19 DIAGNOSIS — E785 Hyperlipidemia, unspecified: Secondary | ICD-10-CM

## 2015-03-19 DIAGNOSIS — M25562 Pain in left knee: Secondary | ICD-10-CM | POA: Diagnosis not present

## 2015-03-19 DIAGNOSIS — E119 Type 2 diabetes mellitus without complications: Secondary | ICD-10-CM | POA: Diagnosis not present

## 2015-03-19 DIAGNOSIS — I10 Essential (primary) hypertension: Secondary | ICD-10-CM

## 2015-03-19 HISTORY — DX: Type 2 diabetes mellitus without complications: E11.9

## 2015-03-19 LAB — LIPID PANEL
CHOL/HDL RATIO: 8
CHOLESTEROL: 290 mg/dL — AB (ref 0–200)
HDL: 35.1 mg/dL — AB (ref >39.00)
NonHDL: 254.9
Triglycerides: 240 mg/dL — ABNORMAL HIGH (ref 0.0–149.0)
VLDL: 48 mg/dL — AB (ref 0.0–40.0)

## 2015-03-19 LAB — VITAMIN D 25 HYDROXY (VIT D DEFICIENCY, FRACTURES): VITD: 14.32 ng/mL — ABNORMAL LOW (ref 30.00–100.00)

## 2015-03-19 LAB — LDL CHOLESTEROL, DIRECT: Direct LDL: 192 mg/dL

## 2015-03-19 MED ORDER — VITAMIN D (ERGOCALCIFEROL) 1.25 MG (50000 UNIT) PO CAPS: 50000.0000 [IU] | ORAL_CAPSULE | ORAL | Status: AC

## 2015-03-19 MED ORDER — ROSUVASTATIN CALCIUM 40 MG PO TABS: 40.0000 mg | ORAL_TABLET | Freq: Every day | ORAL | Status: DC

## 2015-03-19 MED ORDER — KETOROLAC TROMETHAMINE 60 MG/2ML IM SOLN
60.0000 mg | Freq: Once | INTRAMUSCULAR | Status: AC
  Administered 2015-03-19: 60 mg via INTRAMUSCULAR

## 2015-03-19 NOTE — Progress Notes (Signed)
Pre visit review using our clinic review tool, if applicable. No additional management support is needed unless otherwise documented below in the visit note. 

## 2015-03-19 NOTE — Patient Instructions (Addendum)
HTN (hypertension) Stay on hyzaar. Check bp at home. Goal will be to get bp to 130/80(improved since last visit). Call us in 10 days and give us update on bp levels. May need to add other med/  Diabetes mellitus Rx metformin a1-c today.  Hyperlipidemia Lipid panel fasting today. Pt was on 20 mg tabs of crestor.   Follow up date to be determined following lab results and bp check readings.(please get otc bp electronic cuff).  Hand written rx for bp cuff otc.  Follow up date to be determined after lab review.

## 2015-03-19 NOTE — Assessment & Plan Note (Addendum)
Stay on hyzaar. Check bp at home. Goal will be to get bp to 130/80(improved since last visit). Call us in 10 days and give us update on bp levels. May need to add other med(If bp not dropping then I am considering low dose amlodipine in addition to current medication.

## 2015-03-19 NOTE — Assessment & Plan Note (Addendum)
Lipid panel fasting today. Pt was on 20 mg tabs of crestor.

## 2015-03-19 NOTE — Assessment & Plan Note (Addendum)
Pt still has pain. He mentioned just at end as was giving avs. So ordered toradol 60 mg im in office today.I did put in ortho referral on last visit.

## 2015-03-19 NOTE — Addendum Note (Signed)
Addended by: Gwenevere AbbotSAGUIER, Cataleah Stites M on: 03/19/2015 06:41 PM   Modules accepted: Orders

## 2015-03-19 NOTE — Progress Notes (Signed)
Subjective:    Patient ID: Thomas Lester, male    DOB: 07-10-1976, 39 y.o.   MRN: [id number]  HPI  Pt in for follow up. His sugar ws 234 on labs one week.  Pt not reporting does urinate a lot. No polydypsia or polyphagia. I put in a1-c order the other day and he is in to do lab work today.  Pt bp did come down about 30 systolic and diastolic about the same. But that was initial reading. No cardio and neurologic signs or symptoms. Last reading was 150/90.  Pt is fasting today. So will check his lipid panel today.        Review of Systems  Constitutional: Negative for fever, chills, diaphoresis, activity change and fatigue.  Respiratory: Negative for cough, chest tightness and shortness of breath.   Cardiovascular: Negative for chest pain, palpitations and leg swelling.  Gastrointestinal: Negative for nausea, vomiting and abdominal pain.  Endocrine: Positive for polyuria.  Musculoskeletal: Negative for neck pain and neck stiffness.  Neurological: Negative for dizziness, tremors, seizures, syncope, facial asymmetry, speech difficulty, weakness, light-headedness, numbness and headaches.  Psychiatric/Behavioral: Negative for behavioral problems, confusion and agitation. The patient is not nervous/anxious.    Past Medical History  Diagnosis Date  . Hypertension   . High cholesterol   . Diabetes mellitus 03/19/2015    History   Social History  . Marital Status: Significant Other    Spouse Name: N/A  . Number of Children: N/A  . Years of Education: N/A   Occupational History  . Not on file.   Social History Main Topics  . Smoking status: Former Games developer  . Smokeless tobacco: Not on file  . Alcohol Use: Yes     Comment: occassional.  Rare 1 beer.  . Drug Use: No  . Sexual Activity: Yes   Other Topics Concern  . Not on file   Social History Narrative    No past surgical history on file.  Family History  Problem Relation Age of Onset  . Diabetes Mother   . Diabetes  Father   . Hypertension Father     No Known Allergies  Current Outpatient Prescriptions on File Prior to Visit  Medication Sig Dispense Refill  . HYDROcodone-acetaminophen (NORCO) 5-325 MG per tablet Take 1-2 tablets by mouth every 6 (six) hours as needed. 15 tablet 0  . losartan-hydrochlorothiazide (HYZAAR) 100-12.5 MG per tablet Take 1 tablet by mouth daily. 30 tablet 2  . predniSONE (DELTASONE) 10 MG tablet Take 2 tablets (20 mg total) by mouth 2 (two) times daily. 20 tablet 0  . cholecalciferol (VITAMIN D) 1000 UNITS tablet Take 1,000 Units by mouth daily.    . metFORMIN (GLUCOPHAGE) 500 MG tablet Take 1 tablet (500 mg total) by mouth 2 (two) times daily with a meal. (Patient not taking: Reported on 03/19/2015) 60 tablet 3  . rosuvastatin (CRESTOR) 10 MG tablet Take 10 mg by mouth daily.     No current facility-administered medications on file prior to visit.    BP 150/90 mmHg  Pulse 96  Temp(Src) 98 F (36.7 C) (Oral)  Ht  (1.727 m)  Wt 300 lb 6.4 oz (136.261 kg)  BMI 45.69 kg/m2  SpO2 98%        Objective:   Physical Exam  General Mental Status- Alert. General Appearance- Not in acute distress.   Skin General: Color- Normal Color. Moisture- Normal Moisture.  Neck Carotid Arteries- Normal color. Moisture- Normal Moisture. No carotid bruits. No JVD.  Chest and Lung Exam Auscultation: Breath Sounds:-Normal.  Cardiovascular Auscultation:Rythm- Regular. Murmurs & Other Heart Sounds:Auscultation of the heart reveals- No Murmurs.  Abdomen Inspection:-Inspeection Normal. Palpation/Percussion:Note:No mass. Palpation and Percussion of the abdomen reveal- Non Tender, Non Distended + BS, no rebound or guarding.    Neurologic Cranial Nerve exam:- CN III-XII intact(No nystagmus), symmetric smile. Finger to Nose:- Normal/Intact Strength:- 5/5 equal and symmetric strength both upper and lower extremities.  Feet- See quality metric section      Assessment &  Plan:

## 2015-03-19 NOTE — Assessment & Plan Note (Signed)
Rx metformin a1-c today.

## 2015-05-24 ENCOUNTER — Other Ambulatory Visit: Payer: Self-pay | Admitting: Family Medicine

## 2015-05-24 DIAGNOSIS — M25562 Pain in left knee: Secondary | ICD-10-CM

## 2015-06-06 ENCOUNTER — Ambulatory Visit
Admission: RE | Admit: 2015-06-06 | Discharge: 2015-06-06 | Disposition: A | Discharge status: Home / Self Care | Payer: BC Managed Care – PPO | Source: Ambulatory Visit | Attending: Family Medicine | Admitting: Family Medicine

## 2015-06-06 DIAGNOSIS — M25562 Pain in left knee: Secondary | ICD-10-CM

## 2015-06-16 ENCOUNTER — Emergency Department (HOSPITAL_BASED_OUTPATIENT_CLINIC_OR_DEPARTMENT_OTHER)
Admission: EM | Admit: 2015-06-16 | Discharge: 2015-06-16 | Disposition: A | Discharge status: Home / Self Care | Payer: BC Managed Care – PPO | Attending: Emergency Medicine | Admitting: Emergency Medicine

## 2015-06-16 ENCOUNTER — Emergency Department (HOSPITAL_BASED_OUTPATIENT_CLINIC_OR_DEPARTMENT_OTHER): Payer: BC Managed Care – PPO

## 2015-06-16 ENCOUNTER — Encounter (HOSPITAL_BASED_OUTPATIENT_CLINIC_OR_DEPARTMENT_OTHER): Payer: Self-pay | Admitting: *Deleted

## 2015-06-16 DIAGNOSIS — R61 Generalized hyperhidrosis: Secondary | ICD-10-CM | POA: Diagnosis not present

## 2015-06-16 DIAGNOSIS — E7800 Pure hypercholesterolemia, unspecified: Secondary | ICD-10-CM | POA: Insufficient documentation

## 2015-06-16 DIAGNOSIS — I10 Essential (primary) hypertension: Secondary | ICD-10-CM | POA: Insufficient documentation

## 2015-06-16 DIAGNOSIS — Z79899 Other long term (current) drug therapy: Secondary | ICD-10-CM | POA: Diagnosis not present

## 2015-06-16 DIAGNOSIS — N200 Calculus of kidney: Secondary | ICD-10-CM

## 2015-06-16 DIAGNOSIS — R109 Unspecified abdominal pain: Secondary | ICD-10-CM | POA: Diagnosis present

## 2015-06-16 DIAGNOSIS — E119 Type 2 diabetes mellitus without complications: Secondary | ICD-10-CM | POA: Insufficient documentation

## 2015-06-16 DIAGNOSIS — Z87891 Personal history of nicotine dependence: Secondary | ICD-10-CM | POA: Insufficient documentation

## 2015-06-16 LAB — CBC WITH DIFFERENTIAL/PLATELET
BASOS ABS: 0 10*3/uL (ref 0.0–0.1)
BASOS PCT: 1 %
EOS ABS: 0.2 10*3/uL (ref 0.0–0.7)
EOS PCT: 3 %
HCT: 37.2 % — ABNORMAL LOW (ref 39.0–52.0)
Hemoglobin: 12.7 g/dL — ABNORMAL LOW (ref 13.0–17.0)
Lymphocytes Relative: 28 %
Lymphs Abs: 1.7 10*3/uL (ref 0.7–4.0)
MCH: 26.2 pg (ref 26.0–34.0)
MCHC: 34.1 g/dL (ref 30.0–36.0)
MCV: 76.9 fL — ABNORMAL LOW (ref 78.0–100.0)
MONO ABS: 0.5 10*3/uL (ref 0.1–1.0)
MONOS PCT: 7 %
Neutro Abs: 3.8 10*3/uL (ref 1.7–7.7)
Neutrophils Relative %: 61 %
PLATELETS: 302 10*3/uL (ref 150–400)
RBC: 4.84 MIL/uL (ref 4.22–5.81)
RDW: 12.9 % (ref 11.5–15.5)
WBC: 6.3 10*3/uL (ref 4.0–10.5)

## 2015-06-16 LAB — BASIC METABOLIC PANEL
Anion gap: 11 (ref 5–15)
BUN: 19 mg/dL (ref 6–20)
CALCIUM: 8.9 mg/dL (ref 8.9–10.3)
CO2: 24 mmol/L (ref 22–32)
CREATININE: 1.2 mg/dL (ref 0.61–1.24)
Chloride: 102 mmol/L (ref 101–111)
Glucose, Bld: 289 mg/dL — ABNORMAL HIGH (ref 65–99)
Potassium: 3.5 mmol/L (ref 3.5–5.1)
SODIUM: 137 mmol/L (ref 135–145)

## 2015-06-16 LAB — URINE MICROSCOPIC-ADD ON

## 2015-06-16 LAB — LIPASE, BLOOD: Lipase: 15 U/L — ABNORMAL LOW (ref 22–51)

## 2015-06-16 LAB — URINALYSIS, ROUTINE W REFLEX MICROSCOPIC
Bilirubin Urine: NEGATIVE
Glucose, UA: >1000 mg/dL — AB
Ketones, ur: 15 mg/dL — AB
Leukocytes, UA: NEGATIVE
Nitrite: NEGATIVE
PH: 5.5 (ref 5.0–8.0)
Protein, ur: NEGATIVE mg/dL
Specific Gravity, Urine: 1.019 (ref 1.005–1.030)
UROBILINOGEN UA: 0.2 mg/dL (ref 0.0–1.0)

## 2015-06-16 MED ORDER — OXYCODONE-ACETAMINOPHEN 5-325 MG PO TABS
2.0000 | ORAL_TABLET | Freq: Four times a day (QID) | ORAL | Status: DC | PRN
Start: 2015-06-16 — End: 2016-09-02

## 2015-06-16 MED ORDER — IBUPROFEN 800 MG PO TABS: 800.0000 mg | ORAL_TABLET | Freq: Three times a day (TID) | ORAL | Status: DC

## 2015-06-16 MED ORDER — TAMSULOSIN HCL 0.4 MG PO CAPS
0.4000 mg | ORAL_CAPSULE | Freq: Once | ORAL | Status: AC
  Administered 2015-06-16: 0.4 mg via ORAL
  Filled 2015-06-16: qty 1

## 2015-06-16 MED ORDER — HYDROMORPHONE HCL 1 MG/ML IJ SOLN
1.0000 mg | Freq: Once | INTRAMUSCULAR | Status: AC
  Administered 2015-06-16: 1 mg via INTRAVENOUS
  Filled 2015-06-16: qty 1

## 2015-06-16 MED ORDER — ONDANSETRON 4 MG PO TBDP: ORAL_TABLET | ORAL | Status: DC

## 2015-06-16 MED ORDER — ONDANSETRON HCL 4 MG/2ML IJ SOLN
4.0000 mg | Freq: Once | INTRAMUSCULAR | Status: AC
  Administered 2015-06-16: 4 mg via INTRAVENOUS
  Filled 2015-06-16: qty 2

## 2015-06-16 MED ORDER — OXYCODONE-ACETAMINOPHEN 5-325 MG PO TABS
2.0000 | ORAL_TABLET | Freq: Once | ORAL | Status: AC
  Administered 2015-06-16: 2 via ORAL
  Filled 2015-06-16: qty 2

## 2015-06-16 MED ORDER — FENTANYL CITRATE (PF) 100 MCG/2ML IJ SOLN
100.0000 ug | Freq: Once | INTRAMUSCULAR | Status: AC
  Administered 2015-06-16: 100 ug via INTRAVENOUS
  Filled 2015-06-16: qty 2

## 2015-06-16 MED ORDER — TAMSULOSIN HCL 0.4 MG PO CAPS: 0.4000 mg | ORAL_CAPSULE | Freq: Every day | ORAL | Status: DC

## 2015-06-16 MED ORDER — KETOROLAC TROMETHAMINE 30 MG/ML IJ SOLN
30.0000 mg | Freq: Once | INTRAMUSCULAR | Status: AC
  Administered 2015-06-16: 30 mg via INTRAVENOUS
  Filled 2015-06-16: qty 1

## 2015-06-16 NOTE — ED Notes (Signed)
Right flank pain radiating to groin x 1 hour

## 2015-06-16 NOTE — ED Notes (Signed)
Pt is scheduled for Knee Surgery on thursday

## 2015-06-16 NOTE — ED Notes (Signed)
Per pt report that pain is going down into groin from rt lower quadrant. Pt reports sever pain 10/10, sweaty, reports no chest pain or fevers, chills, or SOB prior to coming to ED and before. Requesting ice chips reported to pt the need to wait til seen by MD. Labs drawn and sent to lab. Discussed the need to get urine sample and urinal provided.

## 2015-06-16 NOTE — ED Provider Notes (Signed)
CSN: [id number]     Arrival date & time 06/16/15  1256 History   First MD Initiated Contact with Patient 06/16/15 1314     Chief Complaint  Patient presents with  . Flank Pain     (Consider location/radiation/quality/duration/timing/severity/associated sxs/prior Treatment) Patient is a 39 y.o. male presenting with flank pain.  Flank Pain This is a new problem. The current episode started 3 to 5 hours ago. The problem occurs constantly. The problem has not changed since onset.Associated symptoms include abdominal pain. Pertinent negatives include no chest pain, no headaches and no shortness of breath. Nothing aggravates the symptoms. Nothing relieves the symptoms. He has tried nothing for the symptoms.    Past Medical History  Diagnosis Date  . Hypertension   . High cholesterol   . Diabetes mellitus (HCC) 03/19/2015   History reviewed. No pertinent past surgical history. Family History  Problem Relation Age of Onset  . Diabetes Mother   . Diabetes Father   . Hypertension Father    Social History  Substance Use Topics  . Smoking status: Former Games developer  . Smokeless tobacco: Never Used  . Alcohol Use: Yes     Comment: occassional.  Rare 1 beer.    Review of Systems  Constitutional: Negative for fever and chills.  HENT: Negative for congestion and rhinorrhea.   Eyes: Negative for visual disturbance.  Respiratory: Negative for cough and shortness of breath.   Cardiovascular: Negative for chest pain.  Gastrointestinal: Positive for nausea, vomiting and abdominal pain. Negative for diarrhea and constipation.  Endocrine: Negative for polyuria.  Genitourinary: Positive for flank pain and testicular pain. Negative for dysuria.  Musculoskeletal: Negative for back pain and neck pain.  Skin: Negative for rash and wound.  Neurological: Negative for dizziness, numbness and headaches.  All other systems reviewed and are negative.     Allergies  Review of patient's allergies  indicates no known allergies.  Home Medications   Prior to Admission medications   Medication Sig Start Date End Date Taking? Authorizing Provider  losartan-hydrochlorothiazide (HYZAAR) 100-12.5 MG per tablet Take 1 tablet by mouth daily. 03/13/15  Yes Edward Saguier, PA-C  metFORMIN (GLUCOPHAGE) 500 MG tablet Take 1 tablet (500 mg total) by mouth 2 (two) times daily with a meal. 03/13/15  Yes Esperanza Richters, PA-C  rosuvastatin (CRESTOR) 40 MG tablet Take 1 tablet (40 mg total) by mouth daily. 03/19/15  Yes Edward Saguier, PA-C  Vitamin D, Ergocalciferol, (DRISDOL) 50000 UNITS CAPS capsule Take 1 capsule (50,000 Units total) by mouth every 7 (seven) days. 03/19/15  Yes Edward Saguier, PA-C  ibuprofen (ADVIL,MOTRIN) 800 MG tablet Take 1 tablet (800 mg total) by mouth every 8 (eight) hours. 06/16/15   Marily Memos, MD  ondansetron (ZOFRAN ODT) 4 MG disintegrating tablet  ODT q4 hours prn nausea/vomit 06/16/15   Marily Memos, MD  oxyCODONE-acetaminophen (PERCOCET/ROXICET) 5-325 MG tablet Take 2 tablets by mouth every 6 (six) hours as needed for severe pain. 06/16/15   Marily Memos, MD  tamsulosin (FLOMAX) 0.4 MG CAPS capsule Take 1 capsule (0.4 mg total) by mouth daily. 06/16/15   Barbara Cower Ariana Cavenaugh, MD   BP 176/92 mmHg  Pulse 84  Temp(Src) 97.8 F (36.6 C) (Oral)  Resp 16  Ht  (1.727 m)  Wt 290 lb (131.543 kg)  BMI 44.10 kg/m2  SpO2 100% Physical Exam  Constitutional: He is oriented to person, place, and time. He appears well-developed and well-nourished. He appears distressed (2/2 pain).  HENT:  Head: Normocephalic and atraumatic.  Eyes: Conjunctivae and EOM are normal.  Neck: Normal range of motion. Neck supple.  Cardiovascular: Normal rate and regular rhythm.   Pulmonary/Chest: Effort normal. No respiratory distress.  Abdominal: Soft. There is no tenderness.  Genitourinary: Testes normal and penis normal. Cremasteric reflex is present. Right testis shows no mass, no swelling and no  tenderness. Left testis shows no mass, no swelling and no tenderness.  Musculoskeletal: Normal range of motion. He exhibits no edema or tenderness.  Right CVA TTP  Neurological: He is alert and oriented to person, place, and time.  Skin: Skin is warm. He is diaphoretic.  Nursing note and vitals reviewed.   ED Course  Procedures (including critical care time) Labs Review Labs Reviewed  URINALYSIS, ROUTINE W REFLEX MICROSCOPIC (NOT AT [facility]) - Abnormal; Notable for the following:    Glucose, UA >1000 (*)    Hgb urine dipstick TRACE (*)    Ketones, ur 15 (*)    All other components within normal limits  CBC WITH DIFFERENTIAL/PLATELET - Abnormal; Notable for the following:    Hemoglobin 12.7 (*)    HCT 37.2 (*)    MCV 76.9 (*)    All other components within normal limits  BASIC METABOLIC PANEL - Abnormal; Notable for the following:    Glucose, Bld 289 (*)    All other components within normal limits  LIPASE, BLOOD - Abnormal; Notable for the following:    Lipase 15 (*)    All other components within normal limits  URINE MICROSCOPIC-ADD ON    Imaging Review Ct Renal Stone Study  06/16/2015   CLINICAL DATA:  Right flank pain with radiation into the right groin region.  EXAM: CT ABDOMEN AND PELVIS WITHOUT CONTRAST  TECHNIQUE: Multidetector CT imaging of the abdomen and pelvis was performed following the standard protocol without oral or intravenous contrast material administration.  COMPARISON:  None.  FINDINGS: Lower chest:  Lung bases are clear.  Hepatobiliary: Liver is prominent, measuring 20.6 cm in length. No focal liver lesions are identified on this noncontrast enhanced study. Gallbladder wall is not thickened. There is no biliary duct dilatation.  Pancreas: No mass or inflammatory focus.  Spleen: No lesion identified.  Adrenals/Urinary Tract: Adrenals appear normal bilaterally. Left kidney shows no mass or hydronephrosis. There is no left-sided renal or ureteral calculus. On the  right, the kidney is mildly edematous. There is no right renal mass. There is mild hydronephrosis on the right. There is no intrarenal calculus on the right. There is a 1 mm calculus at the right ureterovesical junction. No other ureteral calculi are identified. Urinary bladder is midline with wall thickness within normal limits.  Stomach/Bowel: There is no bowel wall or mesenteric thickening. No bowel obstruction. No free air or portal venous air.  Vascular/Lymphatic: There is no abdominal aortic aneurysm. No vascular lesion is appreciable on this noncontrast enhanced study. There is no demonstrable adenopathy in the abdomen or pelvis.  Reproductive: Prostate is normal in size and contour. There is no pelvic mass or pelvic fluid collection.  Other: The appendix appears normal. No abscess or adenopathy in the abdomen or pelvis. There is a minimal ventral hernia containing only fat.  Musculoskeletal: There are no blastic or lytic bone lesions. No intramuscular lesion or abdominal wall lesion.  IMPRESSION: 1 mm calculus at the right ureterovesical junction causing mild hydronephrosis and mild right renal edema.  No left sided hydronephrosis. No left-sided renal or ureteral calculi.  No bowel obstruction.  No abscess.  Appendix appears  normal.  Liver prominent without focal lesion.   Electronically Signed   By: Bretta Bang III M.D.   On: 06/16/2015 14:28   I have personally reviewed and evaluated these images and lab results as part of my medical decision-making.   EKG Interpretation None      MDM   Final diagnoses:  Flank pain  Nephrolithiasis   39 yo M w/ a few hours of sharp stabbing pain from right flank radiating to right testicle. Diaphoretic, vomiting on arrival. Significant CVA TTP. Initial ddx for nephrolithiasis v testicular torsion v pyelonephritis. GU exam with normal cremasteric, no swelling, minimal ttp of testicle. Ct with evidence of multiple right sided stones and right renal  fullness. Likely cause of symptoms. tx pain with toradol, narcotics, flomax, fluids, zofran. Improved prior to dc. Will continue to take PO pain meds at home and follow up with [facility] as needed if symptoms don't improve or stone doesn't pass.   I have personally and contemperaneously reviewed labs and imaging and used in my decision making as above.   A medical screening exam was performed and I feel the patient has had an appropriate workup for their chief complaint at this time and likelihood of emergent condition existing is low. They have been counseled on decision, discharge, follow up and which symptoms necessitate immediate return to the emergency department. They or their family verbally stated understanding and agreement with plan and discharged in stable condition.      Marily Memos, MD 06/17/15 [id number]

## 2015-06-17 ENCOUNTER — Telehealth: Payer: Self-pay | Admitting: Medical

## 2015-06-17 NOTE — Telephone Encounter (Signed)
I saw that pt was in ED for kidney stones. But also saw he never followed up for his htn, diabetes or high cholesterol. Would you see if he has plans to follow up. I only saw him couple of times then he never followed up??

## 2015-06-20 ENCOUNTER — Telehealth: Payer: Self-pay | Admitting: Medical

## 2015-06-20 NOTE — Telephone Encounter (Signed)
Did write dismissal letter today. Gave it to Print production planner. Pt has not been since the summer. Had diabetes, htn, hyperlipidemia all uncontrolled historically. When I reviewed ED note prompted to review his chart. No appointment since summer. Appears he has run out of meds or maybe established at other pcp office. MA attempts to call him to schedule appointment were unsuccessful.

## 2015-06-24 ENCOUNTER — Telehealth: Payer: Self-pay | Admitting: Medical

## 2015-06-24 NOTE — Telephone Encounter (Signed)
Patient dismissed from [facility] by Esperanza Richters PA-C , effective June 20, 2015. Dismissal letter sent out by certified / registered mail.  DAJ  Received signed domestic return receipt verifying delivery of certified letter on June 29, 2015. Article number [id number]

## 2015-07-04 ENCOUNTER — Other Ambulatory Visit: Payer: Self-pay | Admitting: Medical

## 2016-09-02 ENCOUNTER — Encounter (HOSPITAL_BASED_OUTPATIENT_CLINIC_OR_DEPARTMENT_OTHER): Payer: Self-pay

## 2016-09-02 ENCOUNTER — Emergency Department (HOSPITAL_BASED_OUTPATIENT_CLINIC_OR_DEPARTMENT_OTHER)
Admission: EM | Admit: 2016-09-02 | Discharge: 2016-09-03 | Disposition: A | Discharge status: Home / Self Care | Payer: BC Managed Care – PPO | Attending: Emergency Medicine | Admitting: Emergency Medicine

## 2016-09-02 ENCOUNTER — Emergency Department (HOSPITAL_BASED_OUTPATIENT_CLINIC_OR_DEPARTMENT_OTHER): Payer: BC Managed Care – PPO

## 2016-09-02 DIAGNOSIS — Z79899 Other long term (current) drug therapy: Secondary | ICD-10-CM | POA: Insufficient documentation

## 2016-09-02 DIAGNOSIS — I10 Essential (primary) hypertension: Secondary | ICD-10-CM | POA: Insufficient documentation

## 2016-09-02 DIAGNOSIS — L03116 Cellulitis of left lower limb: Secondary | ICD-10-CM | POA: Insufficient documentation

## 2016-09-02 DIAGNOSIS — E119 Type 2 diabetes mellitus without complications: Secondary | ICD-10-CM | POA: Diagnosis not present

## 2016-09-02 DIAGNOSIS — Z7984 Long term (current) use of oral hypoglycemic drugs: Secondary | ICD-10-CM | POA: Diagnosis not present

## 2016-09-02 DIAGNOSIS — F17200 Nicotine dependence, unspecified, uncomplicated: Secondary | ICD-10-CM | POA: Diagnosis not present

## 2016-09-02 DIAGNOSIS — M7989 Other specified soft tissue disorders: Secondary | ICD-10-CM | POA: Diagnosis present

## 2016-09-02 LAB — CBG MONITORING, ED: GLUCOSE-CAPILLARY: 187 mg/dL — AB (ref 65–99)

## 2016-09-02 NOTE — ED Provider Notes (Signed)
[facility] Provider Note: Lowella DellJ. Lane Quyen Cutsforth, MD, FACEP  CSN: [id number] MRN: [id number] ARRIVAL: 09/02/16 at 2234 ROOM: MH11/MH11   CHIEF COMPLAINT  Leg Swelling   HISTORY OF PRESENT ILLNESS  Thomas Lester is a 40 y.o. male with a PMHx of diabetes who presents to the Emergency Department for constant, gradually worsening, left low leg pain x 2 days. Pt reports he had a cramp yesterday morning in the affected extremity, and his leg began swelling, burning and itching today. He rates the pain as moderate currently, worse with palpation or weightbearing. He reports subjective fever and a headache yesterday which improved with ibuprofen. Pt follows up at the [facility] now and is no longer [facility].   Past Medical History:  Diagnosis Date  . Diabetes mellitus (HCC) 03/19/2015  . High cholesterol   . Hypertension     Past Surgical History:  Procedure Laterality Date  . KNEE SURGERY      Family History  Problem Relation Age of Onset  . Diabetes Mother   . Diabetes Father   . Hypertension Father     Social History  Substance Use Topics  . Smoking status: Current Every Day Smoker  . Smokeless tobacco: Never Used  . Alcohol use Yes     Comment: occ    Prior to Admission medications   Medication Sig Start Date End Date Taking? Authorizing Provider  amLODipine (NORVASC) 10 MG tablet Take 10 mg by mouth daily.   Yes Historical Provider, MD  atorvastatin (LIPITOR) 40 MG tablet Take 40 mg by mouth daily.   Yes Historical Provider, MD  lisinopril-hydrochlorothiazide (PRINZIDE,ZESTORETIC) 20-12.5 MG tablet Take 1 tablet by mouth daily.   Yes Historical Provider, MD  cephALEXin (KEFLEX) 500 MG capsule Take 1 capsule (500 mg total) by mouth 4 (four) times daily. 09/03/16   Diva Lemberger, MD  HYDROcodone-acetaminophen (NORCO) 5-325 MG tablet Take 1-2 tablets by mouth every 6 (six) hours as needed (for pain). 09/03/16   Haadiya Frogge, MD  metFORMIN (GLUCOPHAGE) 500 MG tablet Take 1  tablet (500 mg total) by mouth 2 (two) times daily with a meal. 03/13/15   Esperanza RichtersEdward Saguier, PA-C  Vitamin D, Ergocalciferol, (DRISDOL) 50000 UNITS CAPS capsule Take 1 capsule (50,000 Units total) by mouth every 7 (seven) days. 03/19/15   Esperanza RichtersEdward Saguier, PA-C    Allergies Patient has no known allergies.   REVIEW OF SYSTEMS  Negative except as noted here or in the History of Present Illness.   PHYSICAL EXAMINATION  Initial Vital Signs Blood pressure 134/75, pulse 109, temperature 98.5 F (36.9 C), temperature source Oral, resp. rate 18, height 5\' 8"  (1.727 m), weight 295 lb (133.8 kg), SpO2 96 %.  Examination General: Well-developed, well-nourished male in no acute distress; appearance consistent with age of record HENT: normocephalic; atraumatic Eyes: pupils equal, round and reactive to light; extraocular muscles intact Neck: supple Heart: regular rate and rhythm; tachycardia Lungs: clear to auscultation bilaterally Abdomen: soft; nondistended; nontender; no masses or hepatosplenomegaly; bowel sounds present Extremities: No deformity; full range of motion; pulses normal; erythema, swelling, warmth and tenderness of left lower leg Neurologic: Awake, alert and oriented; motor function intact in all extremities and symmetric; no facial droop Skin: Warm and dry Psychiatric: Normal mood and affect   RESULTS  Summary of this visit's results, reviewed by myself:   EKG Interpretation  Date/Time:    Ventricular Rate:    PR Interval:    QRS Duration:   QT Interval:  QTC Calculation:   R Axis:     Text Interpretation:        Laboratory Studies: Results for orders placed or performed during the hospital encounter of 09/02/16 (from the past 24 hour(s))  POC CBG, ED     Status: Abnormal   Collection Time: 09/02/16 10:50 PM  Result Value Ref Range   Glucose-Capillary 187 (H) 65 - 99 mg/dL   Imaging Studies: [place] Venous Img Lower Unilateral Left  Result Date: 09/02/2016 CLINICAL  DATA:  40 year old male with left lower extremity swelling and pain. EXAM: Left LOWER EXTREMITY VENOUS DOPPLER ULTRASOUND TECHNIQUE: Gray-scale sonography with graded compression, as well as color Doppler and duplex ultrasound were performed to evaluate the lower extremity deep venous systems from the level of the common femoral vein and including the common femoral, femoral, profunda femoral, popliteal and calf veins including the posterior tibial, peroneal and gastrocnemius veins when visible. The superficial great saphenous vein was also interrogated. Spectral Doppler was utilized to evaluate flow at rest and with distal augmentation maneuvers in the common femoral, femoral and popliteal veins. COMPARISON:  None. FINDINGS: Contralateral Common Femoral Vein: Respiratory phasicity is normal and symmetric with the symptomatic side. No evidence of thrombus. Normal compressibility. Common Femoral Vein: No evidence of thrombus. Normal compressibility, respiratory phasicity and response to augmentation. Saphenofemoral Junction: No evidence of thrombus. Normal compressibility and flow on color Doppler imaging. Profunda Femoral Vein: No evidence of thrombus. Normal compressibility and flow on color Doppler imaging. Femoral Vein: No evidence of thrombus. Normal compressibility, respiratory phasicity and response to augmentation. Popliteal Vein: No evidence of thrombus. Normal compressibility, respiratory phasicity and response to augmentation. Calf Veins: The posterior tibial vein is patent. The peroneal vein is not well visualized. Superficial Great Saphenous Vein: No evidence of thrombus. Normal compressibility and flow on color Doppler imaging. Venous Reflux:  None. Other Findings: Diffuse subcutaneous soft tissue edema. No fluid collection. IMPRESSION: No evidence of deep venous thrombosis. Electronically Signed   By: Elgie CollardArash  Radparvar M.D.   On: 09/02/2016 23:20    ED COURSE  Nursing notes and initial vitals signs,  including pulse oximetry, reviewed.  Vitals:   09/02/16 2239 09/02/16 2241 09/02/16 2329  BP:  142/65 134/75  Pulse:  115 109  Resp:  19 18  Temp:  98.5 F (36.9 C)   TempSrc:  Oral   SpO2:  100% 96%  Weight: 295 lb (133.8 kg) 295 lb (133.8 kg)   Height: 5\' 8"  (1.727 m) 5\' 8"  (1.727 m)    Will treat for cellulitis. He was advised to return for worsening symtpoms, otherwise he will follow up with the [facility].  PROCEDURES    ED DIAGNOSES     ICD-9-CM ICD-10-CM   1. Cellulitis of left lower extremity 682.6 L03.116    I personally performed the services described in this documentation, which was scribed in my presence. The recorded information has been reviewed and is accurate.    Paula LibraJohn Denisa Enterline, MD 09/03/16 [phone]

## 2016-09-02 NOTE — ED Triage Notes (Signed)
Pt requested CBG-done-EDP [place] to triage to assess leg-orders for UA

## 2016-09-02 NOTE — ED Notes (Signed)
Patient transported to Ultrasound 

## 2016-09-02 NOTE — ED Notes (Signed)
ED Provider at bedside. 

## 2016-09-02 NOTE — ED Triage Notes (Signed)
C/o swelling, redness, pain to left LE-first noticed what felt like a cramp yesterday-NAD-slow steady gait

## 2016-09-02 NOTE — ED Notes (Signed)
Unable to assess pt at this time.  He has been taken to US at this time because US dept will close at 11pm.  MD has evaluated pts leg and placed order.

## 2016-09-03 MED ORDER — CEPHALEXIN 500 MG PO CAPS: 500.0000 mg | ORAL_CAPSULE | Freq: Four times a day (QID) | ORAL | 0 refills | Status: AC

## 2016-09-03 MED ORDER — CEPHALEXIN 250 MG PO CAPS
1000.0000 mg | ORAL_CAPSULE | Freq: Once | ORAL | Status: AC
  Administered 2016-09-03: 1000 mg via ORAL
  Filled 2016-09-03: qty 4

## 2016-09-03 MED ORDER — HYDROCODONE-ACETAMINOPHEN 5-325 MG PO TABS: 1.0000 | ORAL_TABLET | Freq: Four times a day (QID) | ORAL | 0 refills | Status: AC | PRN

## 2017-12-25 IMAGING — US US EXTREM LOW VENOUS*L*
1 series · 13 of 24 positions shown · non-contrast
Comparison: None.

CLINICAL DATA: 40-year-old male with left lower extremity swelling
and pain.



[Series 1: us extrem low venous*left* · 0.08mm/px · 13 of 43 slices shown]
[im 1/43]
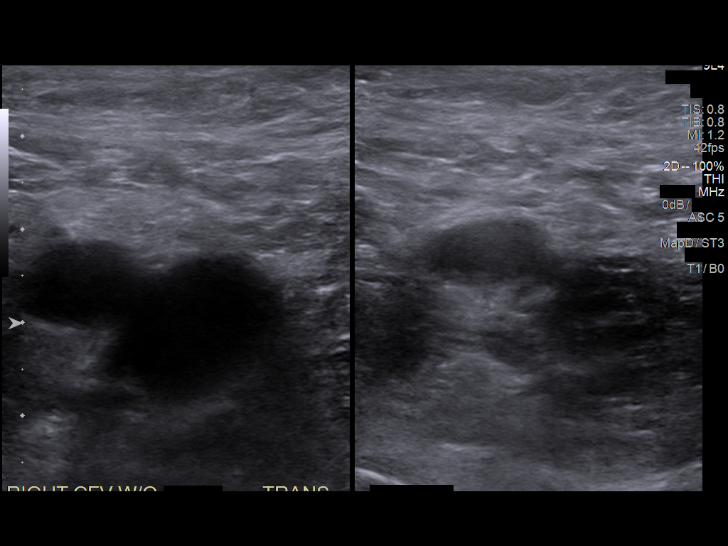
[im 4/43]
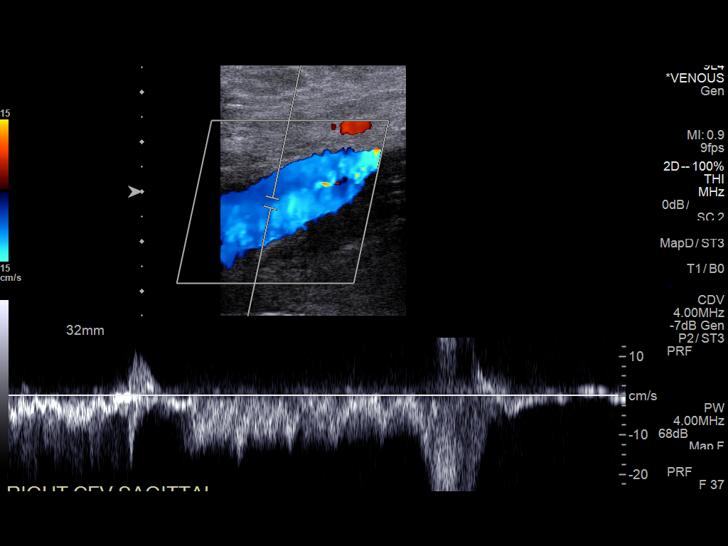
[im 8/43]
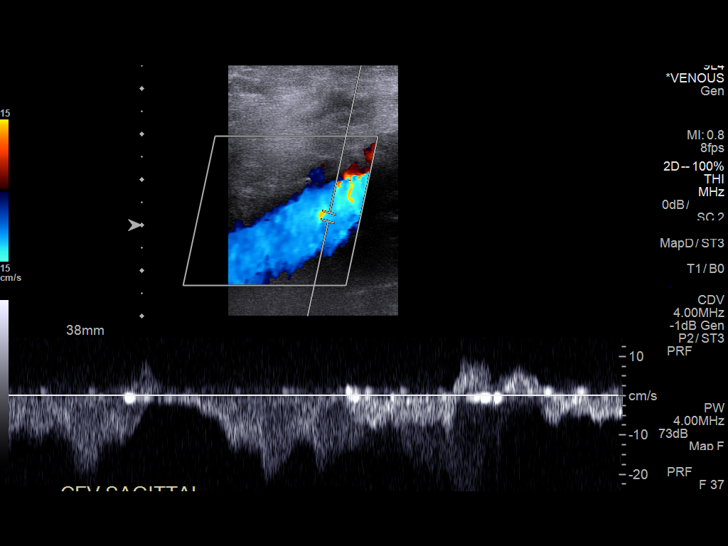
[im 11/43]
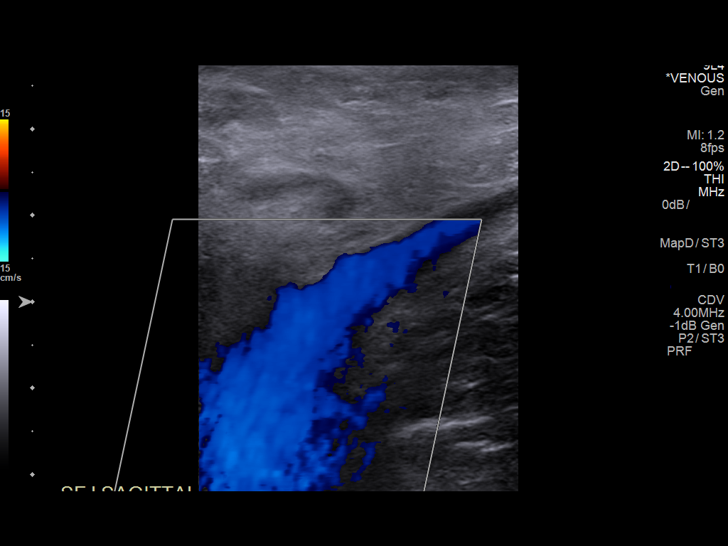
[im 15/43]
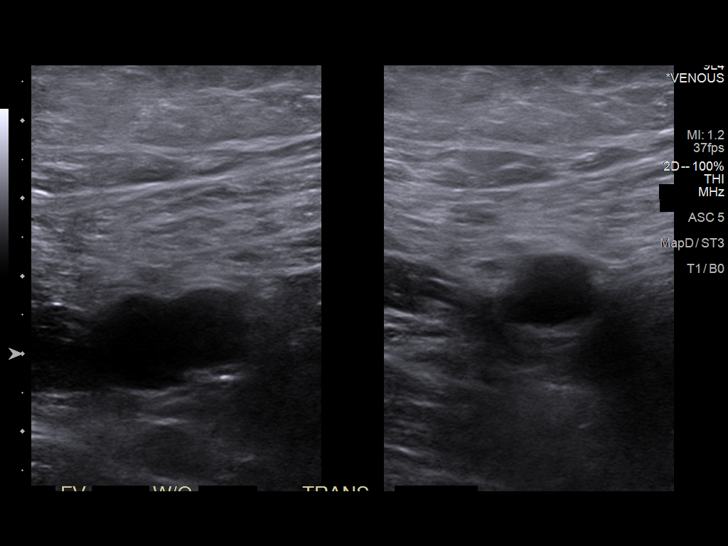
[im 19/43]
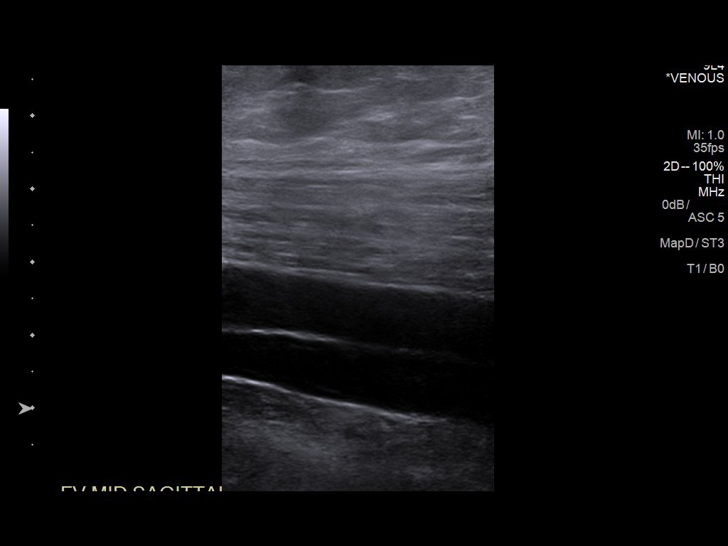
[im 22/43]
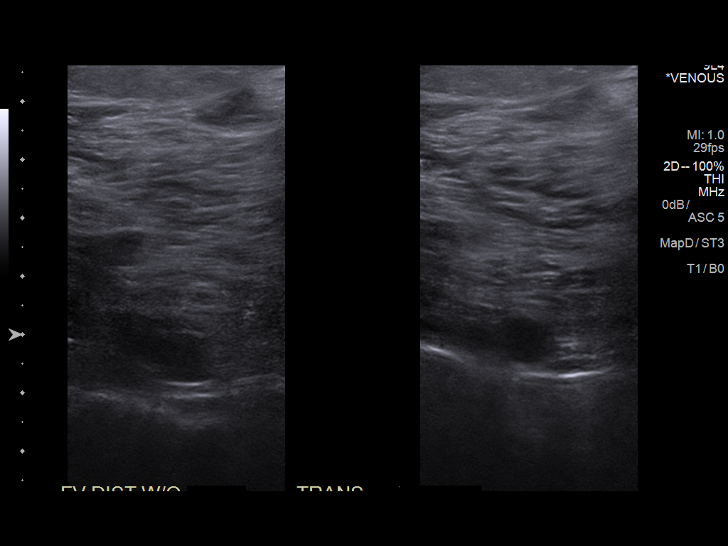
[im 24/43]
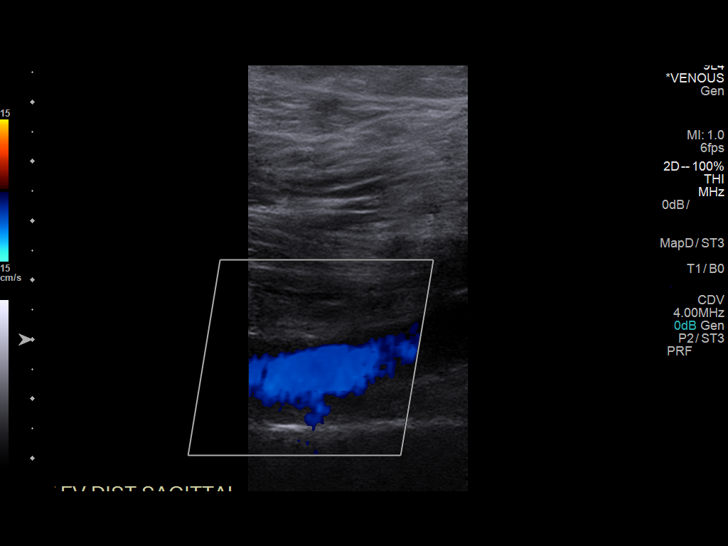
[im 28/43]
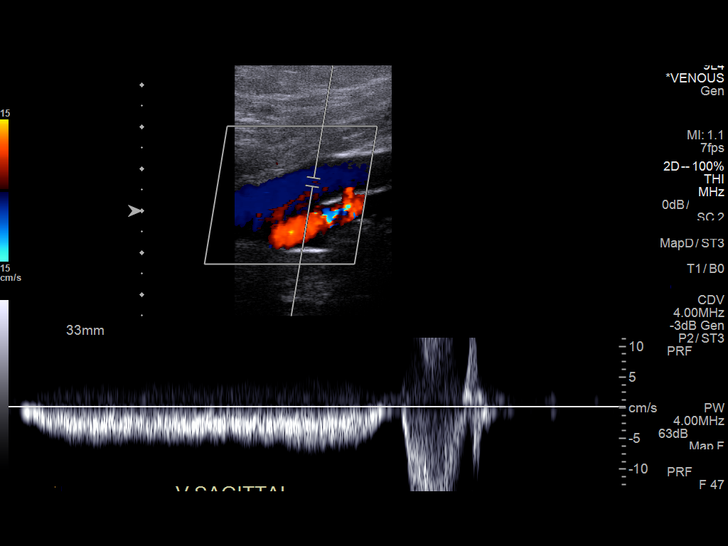
[im 32/43]
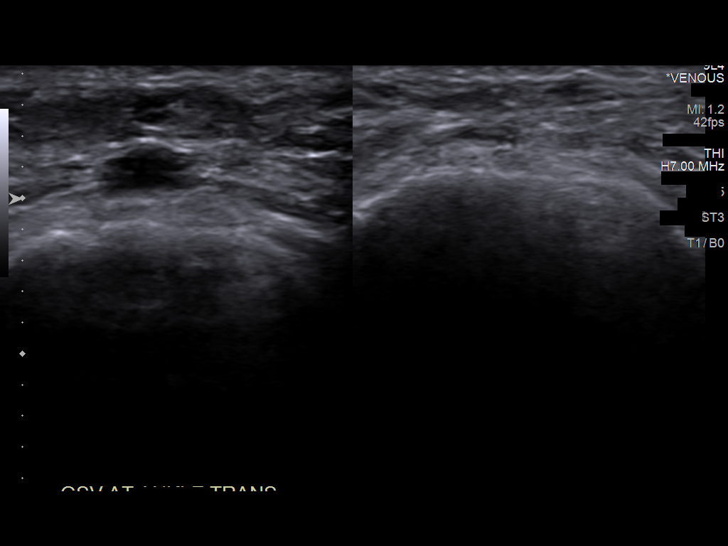
[im 35/43]
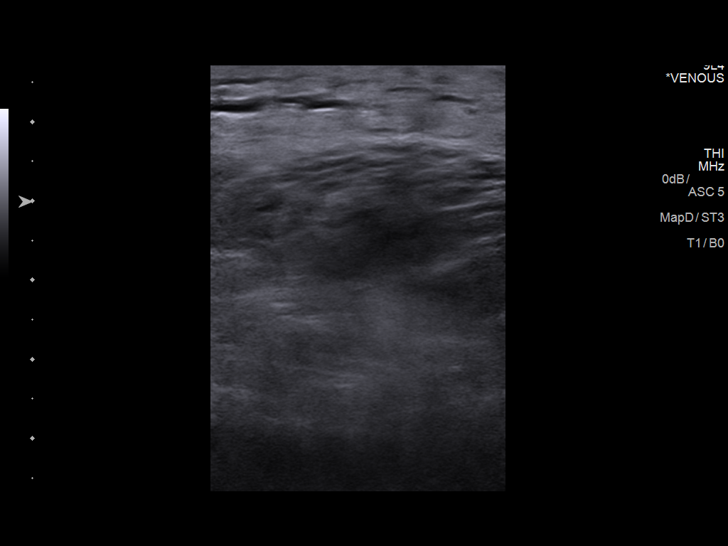
[im 39/43]
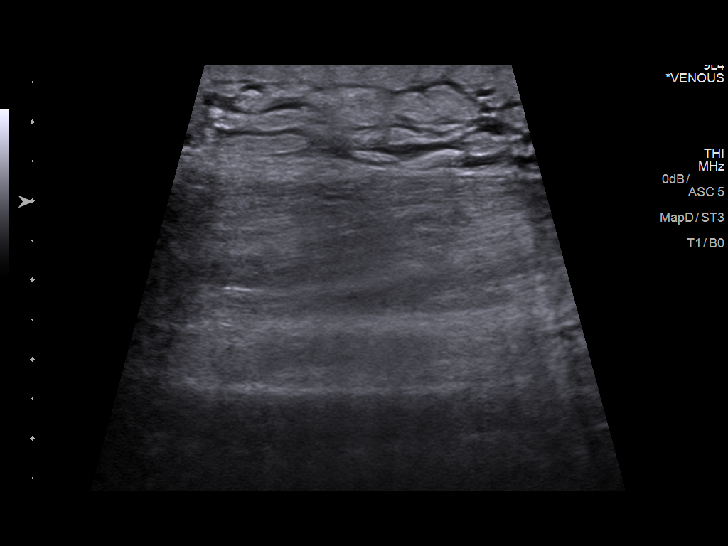
[im 43/43]
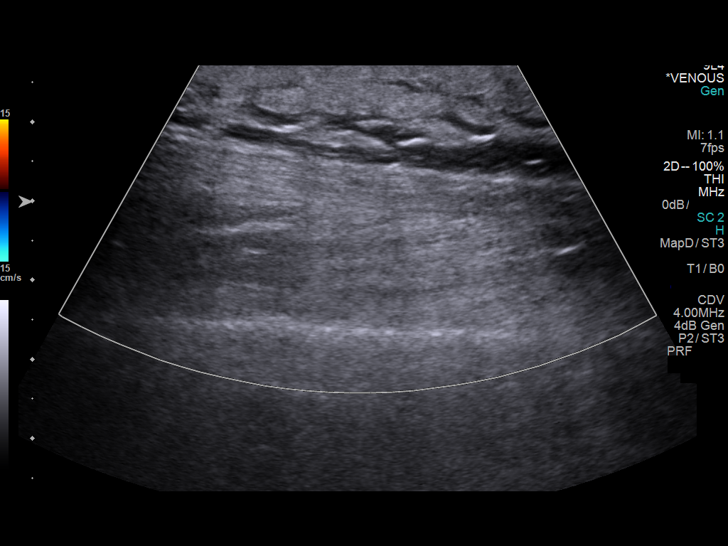

[13 of 24 positions shown; findings below may reference images not displayed]

FINDINGS: Contralateral Common Femoral Vein: Respiratory phasicity is normal
and symmetric with the symptomatic side. No evidence of thrombus.
Normal compressibility.

Common Femoral Vein: No evidence of thrombus. Normal
compressibility, respiratory phasicity and response to augmentation.

Saphenofemoral Junction: No evidence of thrombus. Normal
compressibility and flow on color Doppler imaging.

Profunda Femoral Vein: No evidence of thrombus. Normal
compressibility and flow on color Doppler imaging.

Femoral Vein: No evidence of thrombus. Normal compressibility,
respiratory phasicity and response to augmentation.

Popliteal Vein: No evidence of thrombus. Normal compressibility,
respiratory phasicity and response to augmentation.

Calf Veins: The posterior tibial vein is patent. The peroneal vein
is not well visualized.

Superficial Great Saphenous Vein: No evidence of thrombus. Normal
compressibility and flow on color Doppler imaging.

Venous Reflux:  None.

Other Findings: Diffuse subcutaneous soft tissue edema. No fluid
collection.
IMPRESSION: No evidence of deep venous thrombosis.
